# Patient Record
Sex: Male | Born: 1975 | Race: White | Hispanic: No | Marital: Married | Smoking: Never smoker
Health system: Southern US, Community
[De-identification: ages and names within clinical notes are randomized; demographics above are authoritative.]

## PROBLEM LIST (undated history)

## (undated) DIAGNOSIS — I517 Cardiomegaly: Secondary | ICD-10-CM

## (undated) DIAGNOSIS — E739 Lactose intolerance, unspecified: Secondary | ICD-10-CM

## (undated) HISTORY — DX: Cardiomegaly: I51.7

## (undated) HISTORY — PX: TONSILLECTOMY: SUR1361

## (undated) HISTORY — DX: Lactose intolerance, unspecified: E73.9

---

## 2006-01-09 ENCOUNTER — Emergency Department (HOSPITAL_COMMUNITY): Admission: EM | Admit: 2006-01-09 | Discharge: 2006-01-09 | Payer: Self-pay | Admitting: Emergency Medicine

## 2007-04-12 HISTORY — PX: VASECTOMY: SHX75

## 2008-12-14 ENCOUNTER — Emergency Department (HOSPITAL_BASED_OUTPATIENT_CLINIC_OR_DEPARTMENT_OTHER): Admission: EM | Admit: 2008-12-14 | Discharge: 2008-12-14 | Payer: Self-pay | Admitting: Emergency Medicine

## 2010-07-16 LAB — DIFFERENTIAL
Basophils Absolute: 0 10*3/uL (ref 0.0–0.1)
Basophils Relative: 0 % (ref 0–1)
Eosinophils Absolute: 0.1 10*3/uL (ref 0.0–0.7)
Eosinophils Relative: 1 % (ref 0–5)
Monocytes Absolute: 0.5 10*3/uL (ref 0.1–1.0)
Monocytes Relative: 6 % (ref 3–12)
Neutro Abs: 7.1 10*3/uL (ref 1.7–7.7)

## 2010-07-16 LAB — CBC
Hemoglobin: 15.9 g/dL (ref 13.0–17.0)
MCHC: 34.5 g/dL (ref 30.0–36.0)
MCV: 91 fL (ref 78.0–100.0)
RDW: 12.5 % (ref 11.5–15.5)

## 2010-07-16 LAB — STOOL CULTURE

## 2010-09-05 ENCOUNTER — Inpatient Hospital Stay (INDEPENDENT_AMBULATORY_CARE_PROVIDER_SITE_OTHER)
Admission: RE | Admit: 2010-09-05 | Discharge: 2010-09-05 | Disposition: A | Discharge status: Home / Self Care | Payer: Commercial Managed Care - PPO | Source: Ambulatory Visit | Attending: Emergency Medicine | Admitting: Emergency Medicine

## 2010-09-05 DIAGNOSIS — L0390 Cellulitis, unspecified: Secondary | ICD-10-CM

## 2010-09-05 DIAGNOSIS — S91309A Unspecified open wound, unspecified foot, initial encounter: Secondary | ICD-10-CM

## 2010-09-05 DIAGNOSIS — L0291 Cutaneous abscess, unspecified: Secondary | ICD-10-CM

## 2010-09-10 ENCOUNTER — Ambulatory Visit
Admission: RE | Admit: 2010-09-10 | Discharge: 2010-09-10 | Disposition: A | Discharge status: Home / Self Care | Payer: Commercial Managed Care - PPO | Source: Ambulatory Visit | Attending: Occupational Medicine | Admitting: Occupational Medicine

## 2010-09-10 ENCOUNTER — Other Ambulatory Visit: Payer: Self-pay | Admitting: Occupational Medicine

## 2010-09-10 DIAGNOSIS — Z0000 Encounter for general adult medical examination without abnormal findings: Secondary | ICD-10-CM

## 2016-05-17 DIAGNOSIS — D485 Neoplasm of uncertain behavior of skin: Secondary | ICD-10-CM | POA: Diagnosis not present

## 2016-05-17 DIAGNOSIS — D224 Melanocytic nevi of scalp and neck: Secondary | ICD-10-CM | POA: Diagnosis not present

## 2016-05-17 DIAGNOSIS — D2272 Melanocytic nevi of left lower limb, including hip: Secondary | ICD-10-CM | POA: Diagnosis not present

## 2016-05-17 DIAGNOSIS — D2271 Melanocytic nevi of right lower limb, including hip: Secondary | ICD-10-CM | POA: Diagnosis not present

## 2016-05-17 DIAGNOSIS — D225 Melanocytic nevi of trunk: Secondary | ICD-10-CM | POA: Diagnosis not present

## 2016-05-17 DIAGNOSIS — B078 Other viral warts: Secondary | ICD-10-CM | POA: Diagnosis not present

## 2016-11-11 ENCOUNTER — Ambulatory Visit
Admission: RE | Admit: 2016-11-11 | Discharge: 2016-11-11 | Disposition: A | Discharge status: Home / Self Care | Payer: PRIVATE HEALTH INSURANCE | Source: Ambulatory Visit | Attending: Occupational Medicine | Admitting: Occupational Medicine

## 2016-11-11 ENCOUNTER — Other Ambulatory Visit: Payer: Self-pay | Admitting: Occupational Medicine

## 2016-11-11 DIAGNOSIS — Z021 Encounter for pre-employment examination: Secondary | ICD-10-CM

## 2017-02-03 ENCOUNTER — Ambulatory Visit
Admission: RE | Admit: 2017-02-03 | Discharge: 2017-02-03 | Disposition: A | Discharge status: Home / Self Care | Payer: Worker's Compensation | Source: Ambulatory Visit | Attending: Nurse Practitioner | Admitting: Nurse Practitioner

## 2017-02-03 ENCOUNTER — Other Ambulatory Visit: Payer: Self-pay | Admitting: Nurse Practitioner

## 2017-02-03 DIAGNOSIS — M25512 Pain in left shoulder: Secondary | ICD-10-CM

## 2017-04-11 HISTORY — PX: SHOULDER SURGERY: SHX246

## 2017-05-17 DIAGNOSIS — D225 Melanocytic nevi of trunk: Secondary | ICD-10-CM | POA: Diagnosis not present

## 2017-05-17 DIAGNOSIS — D2261 Melanocytic nevi of right upper limb, including shoulder: Secondary | ICD-10-CM | POA: Diagnosis not present

## 2017-05-17 DIAGNOSIS — D485 Neoplasm of uncertain behavior of skin: Secondary | ICD-10-CM | POA: Diagnosis not present

## 2017-06-12 DIAGNOSIS — M9905 Segmental and somatic dysfunction of pelvic region: Secondary | ICD-10-CM | POA: Diagnosis not present

## 2017-06-12 DIAGNOSIS — M9903 Segmental and somatic dysfunction of lumbar region: Secondary | ICD-10-CM | POA: Diagnosis not present

## 2017-06-12 DIAGNOSIS — M9904 Segmental and somatic dysfunction of sacral region: Secondary | ICD-10-CM | POA: Diagnosis not present

## 2017-06-16 DIAGNOSIS — M9905 Segmental and somatic dysfunction of pelvic region: Secondary | ICD-10-CM | POA: Diagnosis not present

## 2017-06-16 DIAGNOSIS — M9903 Segmental and somatic dysfunction of lumbar region: Secondary | ICD-10-CM | POA: Diagnosis not present

## 2017-06-16 DIAGNOSIS — M9904 Segmental and somatic dysfunction of sacral region: Secondary | ICD-10-CM | POA: Diagnosis not present

## 2017-06-23 DIAGNOSIS — M9905 Segmental and somatic dysfunction of pelvic region: Secondary | ICD-10-CM | POA: Diagnosis not present

## 2017-06-23 DIAGNOSIS — M9903 Segmental and somatic dysfunction of lumbar region: Secondary | ICD-10-CM | POA: Diagnosis not present

## 2017-06-23 DIAGNOSIS — M9904 Segmental and somatic dysfunction of sacral region: Secondary | ICD-10-CM | POA: Diagnosis not present

## 2018-08-08 DIAGNOSIS — M722 Plantar fascial fibromatosis: Secondary | ICD-10-CM | POA: Diagnosis not present

## 2018-08-08 DIAGNOSIS — M79672 Pain in left foot: Secondary | ICD-10-CM | POA: Diagnosis not present

## 2021-03-15 DIAGNOSIS — R9431 Abnormal electrocardiogram [ECG] [EKG]: Secondary | ICD-10-CM

## 2021-03-19 ENCOUNTER — Encounter: Payer: Self-pay | Admitting: Cardiology

## 2021-04-22 NOTE — Progress Notes (Signed)
Cardiology Office Note:    Date:  04/23/2021   ID:  Kevin Bruce, DOB [birth year], MRN [id number]  PCP:  Street, Sharon Mt, MD  Cardiologist:  Shirlee More, MD   Referring MD: [address], Sharon Mt, *  ASSESSMENT:    1. Nonspecific abnormal electrocardiogram (ECG) (EKG)   2. Primary hypertension   3. Chest pain, unspecified type    PLAN:    In order of problems listed above:  I reviewed his EKGs he had a type I early repolarization variant felt to be benign on his first EKG it is not present today.  I do not think his EKG fulfilled criteria for LVH and his echocardiogram shows normal left ventricular size wall thickness systolic and diastolic function.  Left atrium is measured in 2 different manners the first being diameter normal the second is volume mildly enlarged and I do not think he has any true chamber enlargement.  I am on this weekend at the hospital and I will review the echocardiogram myself personally.  In summary I do not think he has structural heart disease.  His EKG pattern is felt to be a benign variant.  He has no family history of arrhythmia sudden death or cardiomyopathy. He has developed adult onset hypertension I think he is done well with his ARB he should continue it and with a BMI breaking 30 I encouraged him to have a moderate weight loss 10 to 20 pounds he should really help with his hypertensive control His chest pain is nonanginal.  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  Although he has no chest wall tenderness I suspect this is from his weightlifting bench pressing greater than 300 pounds and is most consistent with musculoskeletal or localized costal chondral pain.  Next appointment 6 to 8 weeks.  I told him if his cardiac CTA is normal I will give him a call and he would not need to come back to my office   Medication Adjustments/Labs and  Tests Ordered: Current medicines are reviewed at length with the patient today.  Concerns regarding medicines are outlined above.  Orders Placed This Encounter  Procedures   CT CORONARY MORPH W/CTA COR W/SCORE W/CA W/CM &/OR WO/CM   Basic metabolic panel   EKG 85-IDPO   Meds ordered this encounter  Medications   metoprolol tartrate (LOPRESSOR) 100 MG tablet    Sig: Take 1 tablet (100 mg total) by mouth once for 1 dose. Take two hours prior to your cardiac CT    Dispense:  1 tablet    Refill:  0     Chief Complaint  Patient presents with   Abnormal ECG    Interpreted as possible LVH echocardiogram performed showing normal left ventricular size wall thickness systolic and diastolic function.  Left atrium is interpreted as being mildly enlarged however in the mild measurements it was normal.  I reviewed that EKG is most consistent with an early repolarization pattern.  Also his wife is concerned he has had nonexertional chest pain  History of Present Illness:    Kevin Bruce is a 46 y.o. male with a history of hypertension who is being seen today for the evaluation of abnormal EKG with cardiomegaly at the request of Street, Sharon Mt, *.  Recent echocardiogram [facility] 03/15/2021, left ventricle normal size wall thickness systolic and diastolic function right ventricle normal size and function  the left atrium was moderately enlarged by volume but normal in size unable and no valvular abnormality.  EKG 03/09/2021 sinus rhythm early repolarization pattern precordial leads otherwise normal EKG His pattern is type I typically the most common and felt to be a benign variant  His history begins having a routine wellness visit in October. 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 Also his wife has asked [place] to be aware that he is having chest discomfort. This has been going on for several months he is a very vigorous athlete and does bench presses of over 300 pounds.  It occurs several times a week it is not exertional and not relieved with rest in the area of the left breast left lower ribs not positional not pleuritic no associated shortness of breath not severe and at times can wax and wane throughout the day. No associated respiratory or GI symptoms and never occurs with vigorous crosstraining and weightlifting It is not worsened.  He has no history of chest trauma. Family history is noteworthy for father with hypertension in his 45s and a mother with pacemaker in her 30s no family history of cardiomyopathy or sudden cardiac death  Past Medical History:  Diagnosis Date   Lactose intolerance    Helped with Lactaid   Left ventricular hypertrophy by electrocardiogram     Past Surgical History:  Procedure Laterality Date   SHOULDER SURGERY Left 2019   TONSILLECTOMY     As a child   VASECTOMY  2009   [facility]    Current Medications: Current Meds  Medication Sig   esomeprazole (NEXIUM) 20 MG capsule Take 20 mg by mouth daily as needed (reflux).   losartan (COZAAR) 25 MG tablet Take 25 mg by mouth daily.   metoprolol tartrate (LOPRESSOR) 100 MG tablet Take 1 tablet (100 mg total) by mouth once for 1 dose. Take two hours prior to your cardiac CT     Allergies:   Penicillins   Social History   Socioeconomic History   Marital status: Married    Spouse name: Not on file   Number of children: Not on file   Years of education: Not on file   Highest education level: Not on file  Occupational History   Not on file  Tobacco Use   Smoking status:  Never    Passive exposure: Never   Smokeless tobacco: Never  Vaping Use   Vaping Use: Never used  Substance and Sexual Activity   Alcohol use: Yes    Comment: Rare   Drug use: Never   Sexual activity: Not on file  Other Topics Concern   Not on file  Social History Narrative   Not on file   Social Determinants of Health   Financial Resource Strain: Not on file  Food Insecurity: Not on file  Transportation Needs: Not on file  Physical Activity: Not on file  Stress: Not on file  Social Connections: Not on file     Family History: The patient's family history includes COPD in his mother; Colon cancer in his maternal  grandmother; Healthy in his sister; Hypertension in his father; Other in his brother.  ROS:   ROS Please see the history of present illness.     All other systems reviewed and are negative.  EKGs/Labs/Other Studies Reviewed:    The following studies were reviewed today:   EKG:  EKG is  ordered today.  The ekg ordered today is personally reviewed and demonstrates sinus rhythm normal I do not see the early repolarization pattern on his EKG today    Physical Exam:    VS:  BP 130/80 (BP Location: Right Arm)    Pulse 73    Ht 5\' 10"  (1.778 m)    Wt 213 lb 12.8 oz (97 kg)    SpO2 99%    BMI 30.68 kg/m     Wt Readings from Last 3 Encounters:  04/23/21 213 lb 12.8 oz (97 kg)  03/09/21 211 lb (95.7 kg)     GEN:  Well nourished, well developed in no acute distress HEENT: Normal NECK: No JVD; No carotid bruits LYMPHATICS: No lymphadenopathy CARDIAC: RRR, no murmurs, rubs, gallops RESPIRATORY:  Clear to auscultation without rales, wheezing or rhonchi  ABDOMEN: Soft, non-tender, non-distended MUSCULOSKELETAL:  No edema; No deformity  SKIN: Warm and dry NEUROLOGIC:  Alert and oriented x 3 PSYCHIATRIC:  Normal affect     Signed, Shirlee More, MD  04/23/2021 11:21 AM    [place]

## 2021-04-23 ENCOUNTER — Encounter: Payer: Self-pay | Admitting: Cardiology

## 2021-04-23 ENCOUNTER — Other Ambulatory Visit: Payer: Self-pay

## 2021-04-23 ENCOUNTER — Ambulatory Visit: Payer: 59 | Admitting: Cardiology

## 2021-04-23 VITALS — BP 130/80 | HR 73 | Ht 70.0 in | Wt 213.8 lb

## 2021-04-23 DIAGNOSIS — R9431 Abnormal electrocardiogram [ECG] [EKG]: Secondary | ICD-10-CM | POA: Diagnosis not present

## 2021-04-23 DIAGNOSIS — R079 Chest pain, unspecified: Secondary | ICD-10-CM

## 2021-04-23 DIAGNOSIS — I10 Essential (primary) hypertension: Secondary | ICD-10-CM

## 2021-04-23 MED ORDER — METOPROLOL TARTRATE 100 MG PO TABS: 100.0000 mg | ORAL_TABLET | Freq: Once | ORAL | 0 refills | Status: AC

## 2021-04-23 NOTE — Patient Instructions (Signed)
Medication Instructions:  Your physician recommends that you continue on your current medications as directed. Please refer to the Current Medication list given to you today.  *If you need a refill on your cardiac medications before your next appointment, please call your pharmacy*   Lab Work: Your physician recommends that you return for lab work in: Within one week of your cardiac CT BMP If you have labs (blood work) drawn today and your tests are completely normal, you will receive your results only by: [place] (if you have MyChart) OR A paper copy in the mail If you have any lab test that is abnormal or we need to change your treatment, we will call you to review the results.   Testing/Procedures:   Your cardiac CT will be scheduled at the below location:   [facility] [address] [place], [place] [address] [phone]  If scheduled at [facility], please arrive at the [facility] main entrance (entrance A) of [employer] 30 minutes prior to test start time. You can use the FREE valet parking offered at the main entrance (encouraged to control the heart rate for the test) Proceed to the [facility] Radiology Department (first floor) to check-in and test prep.  Please follow these instructions carefully (unless otherwise directed):  On the Night Before the Test: Be sure to Drink plenty of water. Do not consume any caffeinated/decaffeinated beverages or chocolate 12 hours prior to your test. Do not take any antihistamines 12 hours prior to your test.  On the Day of the Test: Drink plenty of water until 1 hour prior to the test. Do not eat any food 4 hours prior to the test. You may take your regular medications prior to the test.  Take metoprolol (Lopressor) two hours prior to test.  After the Test: Drink plenty of water. After receiving IV contrast, you may experience a mild flushed feeling. This is normal. On occasion, you may  experience a mild rash up to 24 hours after the test. This is not dangerous. If this occurs, you can take Benadryl 25 mg and increase your fluid intake. If you experience trouble breathing, this can be serious. If it is severe call 911 IMMEDIATELY. If it is mild, please call our office. If you take any of these medications: Glipizide/Metformin, Avandament, Glucavance, please do not take 48 hours after completing test unless otherwise instructed.  Please allow 2-4 weeks for scheduling of routine cardiac CTs. Some insurance companies require a pre-authorization which may delay scheduling of this test.   For non-scheduling related questions, please contact the cardiac imaging nurse navigator should you have any questions/concerns: Marchia Bond, Cardiac Imaging Nurse Navigator Gordy Clement, Cardiac Imaging Nurse Navigator [facility] Direct Office Dial: [phone]   For scheduling needs, including cancellations and rescheduling, please call [place], [phone].    Follow-Up: At [facility], you and your health needs are our priority.  As part of our continuing mission to provide you with exceptional heart care, we have created designated Provider Care Teams.  These Care Teams include your primary Cardiologist (physician) and Advanced Practice Providers (APPs -  Physician Assistants and Nurse Practitioners) who all work together to provide you with the care you need, when you need it.  We recommend signing up for the patient portal called "MyChart".  Sign up information is provided on this After Visit Summary.  MyChart is used to connect with patients for Virtual Visits (Telemedicine).  Patients  are able to view lab/test results, encounter notes, upcoming appointments, etc.  Non-urgent messages can be sent to your provider as well.   To learn more about what you can do with MyChart, go to [email or web address].    Your next appointment:   8 week(s)  The  format for your next appointment:   In Person  Provider:   Shirlee More, MD    Other Instructions

## 2021-05-18 ENCOUNTER — Telehealth (HOSPITAL_COMMUNITY): Payer: Self-pay | Admitting: Emergency Medicine

## 2021-05-18 NOTE — Telephone Encounter (Signed)
Reaching out to patient to offer assistance regarding upcoming cardiac imaging study; pt verbalizes understanding of appt date/time, parking situation and where to check in, pre-test NPO status and medications ordered, and verified current allergies; name and call back number provided for further questions should they arise Marchia Bond RN Navigator Cardiac Imaging [facility] [phone] office [phone] cell  Getting labs in the AM.  100mg  metoprolol tartrate Arrival 730  Denies iv issues

## 2021-05-18 NOTE — Telephone Encounter (Signed)
Attempted to call patient regarding upcoming cardiac CT appointment. Left message on voicemail with name and callback number Rebbie Lauricella RN Navigator Cardiac Imaging [facility] [phone] Office [phone] Cell  Reminded to get labs 

## 2021-05-19 ENCOUNTER — Ambulatory Visit (HOSPITAL_COMMUNITY): Payer: 59

## 2021-05-19 ENCOUNTER — Other Ambulatory Visit: Payer: Self-pay

## 2021-05-19 ENCOUNTER — Other Ambulatory Visit: Payer: 59 | Admitting: *Deleted

## 2021-05-19 DIAGNOSIS — R079 Chest pain, unspecified: Secondary | ICD-10-CM

## 2021-05-19 LAB — BASIC METABOLIC PANEL
BUN/Creatinine Ratio: 13 (ref 9–20)
BUN: 16 mg/dL (ref 6–24)
CO2: 27 mmol/L (ref 20–29)
Calcium: 9.6 mg/dL (ref 8.7–10.2)
Chloride: 102 mmol/L (ref 96–106)
Creatinine, Ser: 1.28 mg/dL — ABNORMAL HIGH (ref 0.76–1.27)
Glucose: 97 mg/dL (ref 70–99)
Potassium: 4.5 mmol/L (ref 3.5–5.2)
Sodium: 143 mmol/L (ref 134–144)
eGFR: 71 mL/min/{1.73_m2} (ref >59)

## 2021-05-20 ENCOUNTER — Ambulatory Visit (HOSPITAL_COMMUNITY)
Admission: RE | Admit: 2021-05-20 | Discharge: 2021-05-20 | Disposition: A | Discharge status: Home / Self Care | Payer: 59 | Source: Ambulatory Visit | Attending: Cardiology | Admitting: Cardiology

## 2021-05-20 ENCOUNTER — Encounter (HOSPITAL_COMMUNITY): Payer: Self-pay

## 2021-05-20 DIAGNOSIS — R079 Chest pain, unspecified: Secondary | ICD-10-CM

## 2021-05-20 MED ORDER — NITROGLYCERIN 0.4 MG SL SUBL
0.8000 mg | SUBLINGUAL_TABLET | Freq: Once | SUBLINGUAL | Status: AC
  Administered 2021-05-20: 0.8 mg via SUBLINGUAL

## 2021-05-20 MED ORDER — IOHEXOL 350 MG/ML SOLN
95.0000 mL | Freq: Once | INTRAVENOUS | Status: AC | PRN
  Administered 2021-05-20: 95 mL via INTRAVENOUS

## 2021-05-20 MED ORDER — NITROGLYCERIN 0.4 MG SL SUBL
SUBLINGUAL_TABLET | SUBLINGUAL | Status: AC
  Filled 2021-05-20: qty 2

## 2021-06-25 ENCOUNTER — Ambulatory Visit: Payer: 59 | Admitting: Cardiology

## 2021-10-19 ENCOUNTER — Ambulatory Visit: Payer: 59 | Admitting: Podiatry

## 2021-10-19 ENCOUNTER — Ambulatory Visit (INDEPENDENT_AMBULATORY_CARE_PROVIDER_SITE_OTHER): Payer: 59

## 2021-10-19 DIAGNOSIS — M722 Plantar fascial fibromatosis: Secondary | ICD-10-CM

## 2021-10-19 MED ORDER — METHYLPREDNISOLONE 4 MG PO TBPK: ORAL_TABLET | ORAL | 0 refills | Status: AC

## 2021-10-19 MED ORDER — MELOXICAM 15 MG PO TABS: 15.0000 mg | ORAL_TABLET | Freq: Every day | ORAL | 1 refills | Status: AC

## 2021-10-19 MED ORDER — BETAMETHASONE SOD PHOS & ACET 6 (3-3) MG/ML IJ SUSP
3.0000 mg | Freq: Once | INTRAMUSCULAR | Status: AC
  Administered 2021-10-19: 3 mg via INTRA_ARTICULAR

## 2021-10-19 NOTE — Progress Notes (Signed)
   Subjective: 46 y.o. male presenting to the office today as a new patient for evaluation of chronic bilateral heel pain/plantar fasciitis.  Patient states that he has had plantar fasciitis intermittently for several years.  He has worn custom molded orthotics which helped significantly however recently over the last few months the pain has returned and is not resolving on its own.  He presents for further treatment and evaluation   Past Medical History:  Diagnosis Date   Lactose intolerance    Helped with Lactaid   Left ventricular hypertrophy by electrocardiogram    Past Surgical History:  Procedure Laterality Date   SHOULDER SURGERY Left 2019   TONSILLECTOMY     As a child   VASECTOMY  2009   [facility]   Allergies  Allergen Reactions   Penicillins Rash     Objective: Physical Exam General: The patient is alert and oriented x3 in no acute distress.  Dermatology: Skin is warm, dry and supple bilateral lower extremities. Negative for open lesions or macerations bilateral.   Vascular: Dorsalis Pedis and Posterior Tibial pulses palpable bilateral.  Capillary fill time is immediate to all digits.  Neurological: Epicritic and protective threshold intact bilateral.   Musculoskeletal: Tenderness to palpation to the plantar aspect of the right and left heel along the plantar fascia.  Right greater than left.  All other joints range of motion within normal limits bilateral. Strength 5/5 in all groups bilateral.   Radiographic exam: Normal osseous mineralization. Joint spaces preserved. No fracture/dislocation/boney destruction. No other soft tissue abnormalities or radiopaque foreign bodies.  Plantar and posterior heel spurs noted on lateral view bilateral  Assessment: 1. Plantar fasciitis bilateral. RT > LT  Plan of Care:  1. Patient evaluated. Xrays reviewed.   2. Injection of 0.5cc Celestone soluspan injected into the right plantar fascia  3. Rx for Medrol Dose Pack  placed 4. Rx for Meloxicam ordered for patient. 5.  Prescription for custom molded orthotics provided for the patient to take to [place] lab 6. Instructed patient regarding therapies and modalities at home to alleviate symptoms.  7. Return to clinic in 4 weeks.     Edrick Kins, DPM [facility]  Dr. Edrick Kins, DPM    [address], [place] [address]                Office [phone]  Fax [phone]

## 2022-01-04 ENCOUNTER — Ambulatory Visit
Admission: RE | Admit: 2022-01-04 | Discharge: 2022-01-04 | Disposition: A | Discharge status: Home / Self Care | Payer: 59 | Source: Ambulatory Visit | Attending: Nurse Practitioner | Admitting: Nurse Practitioner

## 2022-01-04 ENCOUNTER — Other Ambulatory Visit: Payer: Self-pay | Admitting: Nurse Practitioner

## 2022-01-04 DIAGNOSIS — Z0000 Encounter for general adult medical examination without abnormal findings: Secondary | ICD-10-CM

## 2022-06-01 ENCOUNTER — Emergency Department (HOSPITAL_BASED_OUTPATIENT_CLINIC_OR_DEPARTMENT_OTHER)
Admission: EM | Admit: 2022-06-01 | Discharge: 2022-06-01 | Disposition: A | Discharge status: Home / Self Care | Payer: No Typology Code available for payment source | Attending: Emergency Medicine | Admitting: Emergency Medicine

## 2022-06-01 ENCOUNTER — Other Ambulatory Visit: Payer: Self-pay

## 2022-06-01 DIAGNOSIS — Z021 Encounter for pre-employment examination: Secondary | ICD-10-CM | POA: Diagnosis present

## 2022-06-01 DIAGNOSIS — Z79899 Other long term (current) drug therapy: Secondary | ICD-10-CM | POA: Diagnosis not present

## 2022-06-01 DIAGNOSIS — Z0283 Encounter for blood-alcohol and blood-drug test: Secondary | ICD-10-CM

## 2022-06-01 DIAGNOSIS — I10 Essential (primary) hypertension: Secondary | ICD-10-CM | POA: Insufficient documentation

## 2022-06-01 NOTE — ED Triage Notes (Signed)
Pt here for an incident at work. Pt reports he only needs lab testing. He has no other complaints.

## 2022-06-01 NOTE — ED Provider Notes (Addendum)
  [facility] Provider Note   CSN: [id number] Arrival date & time: 06/01/22  [id number]     History  No chief complaint on file.   Kevin Bruce is a 47 y.o. male.  HPI 47 year old male presents for employment drug testing. 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 He has no complaints.   Home Medications Prior to Admission medications   Medication Sig Start Date End Date Taking? Authorizing Provider  esomeprazole (NEXIUM) 20 MG capsule Take 20 mg by mouth daily as needed (reflux).    [provider]  losartan (COZAAR) 25 MG tablet Take 25 mg by mouth daily.    [provider]  meloxicam (MOBIC) 15 MG tablet Take 1 tablet (15 mg total) by mouth daily. 10/19/21   Edrick Kins, DPM  methylPREDNISolone (MEDROL DOSEPAK) 4 MG TBPK tablet 6 day dose pack - take as directed 10/19/21   Edrick Kins, DPM  metoprolol tartrate (LOPRESSOR) 100 MG tablet Take 1 tablet (100 mg total) by mouth once for 1 dose. Take two hours prior to your cardiac CT 04/23/21 04/23/21  Richardo Priest, MD      Allergies    Penicillins    Review of Systems   Review of Systems  Physical Exam Updated Vital Signs BP (!) 161/91 (BP Location: Right Arm)   Pulse 67   Temp 98 F (36.7 C)   Resp 14   SpO2 100%  Physical Exam Vitals and nursing note reviewed.  Constitutional:      General: He is not in acute distress.    Appearance: He is well-developed. He is not ill-appearing or diaphoretic.  HENT:     Head: Normocephalic and atraumatic.  Pulmonary:     Effort: Pulmonary effort is normal.  Neurological:     Mental Status: He is alert.  Psychiatric:        Mood and Affect: Mood is not anxious.     ED Results / Procedures / Treatments   Labs (all labs ordered are listed, but only abnormal results are displayed) Labs Reviewed - No data to display  EKG None  Radiology No results  found.  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  There were no injuries related to the incident and he is here solely for testing.  No medical complaints.  He is noted to be hypertensive, patient states that he no longer takes his meds though I have encouraged him to reevaluate this and talk to his PCP.  He declines a refill of his meds currently.        Final Clinical Impression(s) / ED Diagnoses Final diagnoses:  Encounter for employment-related drug testing  Asymptomatic hypertension    Rx / DC Orders ED Discharge Orders     None         Sherwood Gambler, MD 06/01/22 [id number]    Sherwood Gambler, MD 06/01/22 [phone]

## 2022-07-23 IMAGING — CT CT HEART MORP W/ CTA COR W/ SCORE W/ CA W/CM &/OR W/O CM
4 of 7 series · 8 of 20 positions shown, 9 images · IV contrast (APPLIED)
Comparison: None.
COMPARISON: None.

Addendum:
EXAM:
OVER-READ INTERPRETATION  CT CHEST

The following report is an over-read performed by radiologist Dr.
Jordan Alejandro Bailo [[email or web address]] on 05/20/2021. This
over-read does not include interpretation of cardiac or coronary
anatomy or pathology. The coronary calcium score/coronary CTA
interpretation by the cardiologist is attached.
HISTORY: 45 yo male with chest pain/anginal equiv, ECGs or troponins abnormal
Cardiac/Coronary CTA
TECHNIQUE: The patient was scanned on a Siemens Force scanner.
PROTOCOL: A 100 kV prospective scan was triggered in the descending thoracic
aorta at 111 HU's. Axial non-contrast 3 mm slices were carried out
through the heart. The data set was analyzed on a dedicated work
station and scored using the Agatson method. Gantry rotation speed
was 250 msecs and collimation was .6 mm. Beta blockade and 0.8 mg of
sl NTG was given. The 3D data set was reconstructed in 5% intervals
of the 35-75 % of the R-R cycle. Diastolic phases were analyzed on a
dedicated work station using MPR, MIP and VRT modes. The patient
received 95mL OMNIPAQUE IOHEXOL 350 MG/ML SOLN of contrast.

[Series 6: best diast · axial · 0.39mm/px · z∈[+1180,+1225]mm · 2 of 338 slices shown, 3 images]
[im 113/338  vessel]
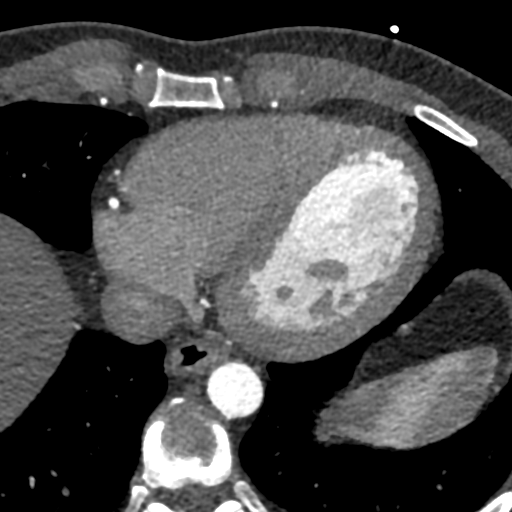
[im 113/338  lung]
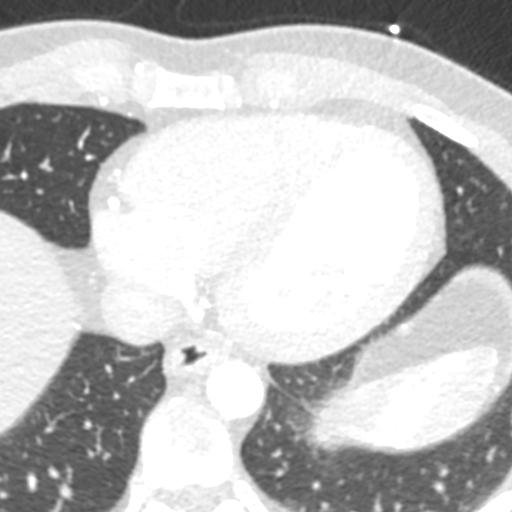
[im 225/338  vessel]
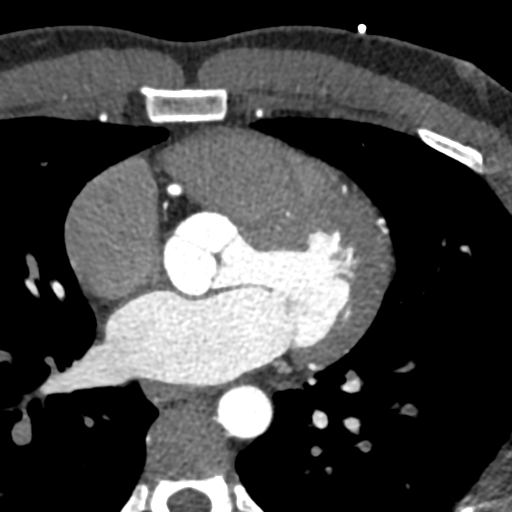

[Series 7: best syst · axial · 0.39mm/px · z∈[+1180,+1225]mm · 2 of 338 slices shown]
[im 113/338  vessel]
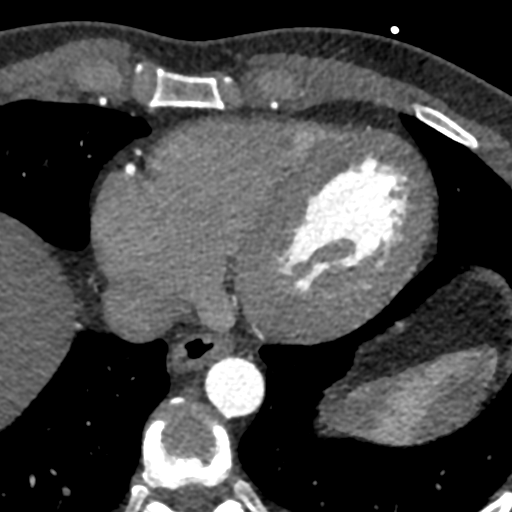
[im 225/338  vessel]
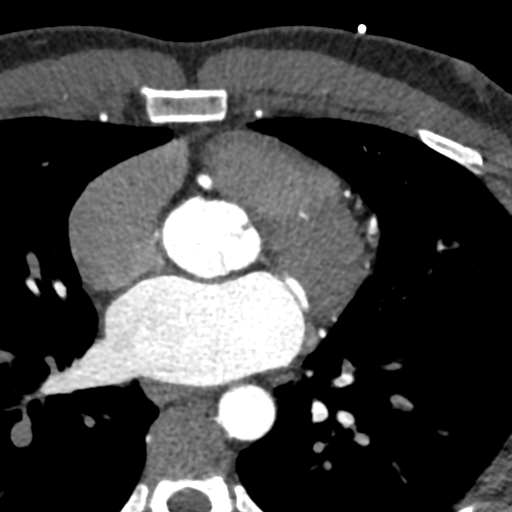

[Series 8: ts diast sharp · axial · 0.39mm/px · z∈[+1180,+1225]mm · 2 of 338 slices shown]
[im 113/338  lung]
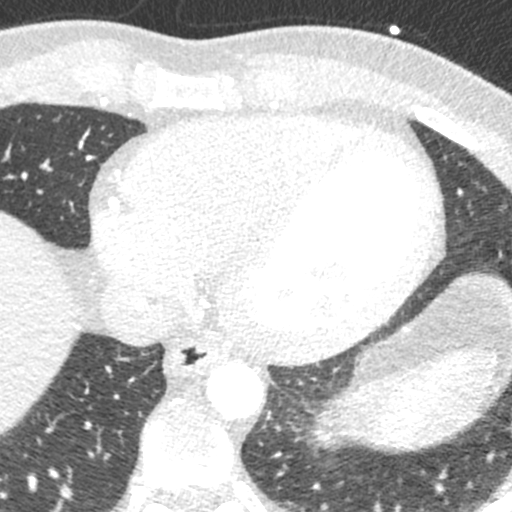
[im 225/338  lung]
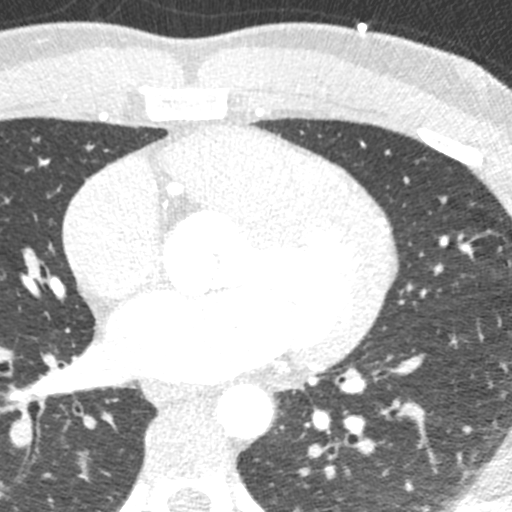

[Series 9: ts syst sharp · axial · 0.39mm/px · z∈[+1180,+1225]mm · 2 of 338 slices shown]
[im 113/338  lung]
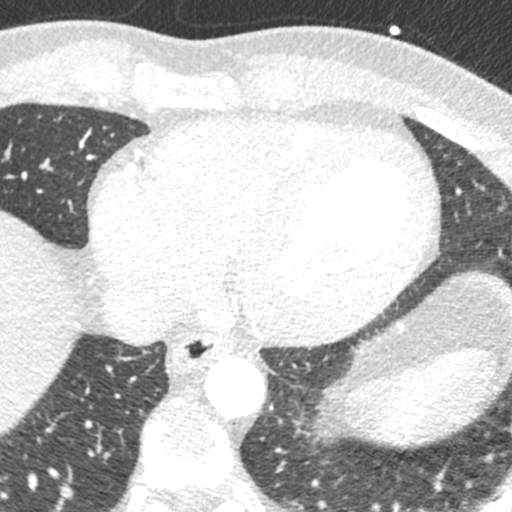
[im 225/338  lung]
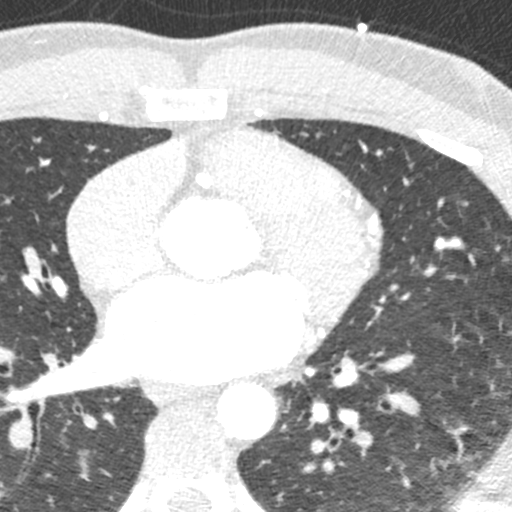

[8 of 20 positions shown; findings below may reference images not displayed]

FINDINGS: Within the visualized portions of the thorax there are no suspicious
appearing pulmonary nodules or masses, there is no acute
consolidative airspace disease, no pleural effusions, no
pneumothorax and no lymphadenopathy. Visualized portions of the
upper abdomen are unremarkable. There are no aggressive appearing
lytic or blastic lesions noted in the visualized portions of the
skeleton.
IMPRESSION: 1. No significant incidental noncardiac findings are noted.
FINDINGS: Quality: Very good, HR 54

Coronary calcium score: The patient's coronary artery calcium score
is 0, which places the patient in the 0 percentile.

Coronary arteries: Normal coronary origins.  Right dominance.

Right Coronary Artery: Dominant.  Normal

Left Main Coronary Artery: Normal. Bifurcates into the LAD and LCx
arteries as usual.

Left Anterior Descending Coronary Artery: Normal, anterior vessel
that reaches the apex. Large D1 which branches, no disease.

Left Circumflex Artery: Normal.

Aorta: Normal size, 30 mm at the mid ascending aorta (level of the
PA bifurcation) measured double oblique. No calcifications. No
dissection.

Aortic Valve: Trileaflet. No calcifications.

Other findings:

Normal pulmonary vein drainage into the left atrium.

Normal left atrial appendage without a thrombus.

Mildly dilated main pulmonary artery at 30 mm, possibly suggestive
of pulmonary hypertension.
IMPRESSION: 1. No evidence of CAD, CADRADS = 0.

2. Coronary calcium score of 0. This was 0 percentile for age and
sex matched control.

3. Normal coronary origin with right dominance.

4. Mildly dilated main pulmonary artery at 30 mm, possibly
suggestive of pulmonary hypertension.

5. Consider non-coronary causes of chest pain.

*** End of Addendum ***
EXAM:
OVER-READ INTERPRETATION  CT CHEST

The following report is an over-read performed by radiologist Dr.
Jordan Alejandro Bailo [REDACTED] on 05/20/2021. This
over-read does not include interpretation of cardiac or coronary
anatomy or pathology. The coronary calcium score/coronary CTA
interpretation by the cardiologist is attached.
FINDINGS: Within the visualized portions of the thorax there are no suspicious
appearing pulmonary nodules or masses, there is no acute
consolidative airspace disease, no pleural effusions, no
pneumothorax and no lymphadenopathy. Visualized portions of the
upper abdomen are unremarkable. There are no aggressive appearing
lytic or blastic lesions noted in the visualized portions of the
skeleton.
IMPRESSION: 1. No significant incidental noncardiac findings are noted.

## 2022-08-08 ENCOUNTER — Encounter (HOSPITAL_BASED_OUTPATIENT_CLINIC_OR_DEPARTMENT_OTHER): Payer: Self-pay | Admitting: Emergency Medicine

## 2022-08-08 ENCOUNTER — Other Ambulatory Visit: Payer: Self-pay

## 2022-08-08 ENCOUNTER — Emergency Department (HOSPITAL_BASED_OUTPATIENT_CLINIC_OR_DEPARTMENT_OTHER)
Admission: EM | Admit: 2022-08-08 | Discharge: 2022-08-08 | Disposition: A | Discharge status: Home / Self Care | Payer: 59 | Attending: Emergency Medicine | Admitting: Emergency Medicine

## 2022-08-08 ENCOUNTER — Emergency Department (HOSPITAL_BASED_OUTPATIENT_CLINIC_OR_DEPARTMENT_OTHER): Payer: 59 | Admitting: Radiology

## 2022-08-08 DIAGNOSIS — R079 Chest pain, unspecified: Secondary | ICD-10-CM | POA: Insufficient documentation

## 2022-08-08 DIAGNOSIS — Z79899 Other long term (current) drug therapy: Secondary | ICD-10-CM | POA: Insufficient documentation

## 2022-08-08 DIAGNOSIS — I10 Essential (primary) hypertension: Secondary | ICD-10-CM | POA: Insufficient documentation

## 2022-08-08 LAB — BASIC METABOLIC PANEL
Anion gap: 7 (ref 5–15)
BUN: 17 mg/dL (ref 6–20)
CO2: 29 mmol/L (ref 22–32)
Calcium: 9.1 mg/dL (ref 8.9–10.3)
Chloride: 105 mmol/L (ref 98–111)
Creatinine, Ser: 1.19 mg/dL (ref 0.61–1.24)
GFR, Estimated: >60 mL/min (ref >60)
Glucose, Bld: 89 mg/dL (ref 70–99)
Potassium: 3.9 mmol/L (ref 3.5–5.1)
Sodium: 141 mmol/L (ref 135–145)

## 2022-08-08 LAB — TROPONIN I (HIGH SENSITIVITY)
Troponin I (High Sensitivity): 2 ng/L (ref <18)
Troponin I (High Sensitivity): 2 ng/L (ref <18)

## 2022-08-08 LAB — CBC
HCT: 42.1 % (ref 39.0–52.0)
Hemoglobin: 14.4 g/dL (ref 13.0–17.0)
MCH: 31.2 pg (ref 26.0–34.0)
MCHC: 34.2 g/dL (ref 30.0–36.0)
MCV: 91.3 fL (ref 80.0–100.0)
Platelets: 285 10*3/uL (ref 150–400)
RBC: 4.61 MIL/uL (ref 4.22–5.81)
RDW: 12.8 % (ref 11.5–15.5)
WBC: 8.6 10*3/uL (ref 4.0–10.5)
nRBC: 0 % (ref 0.0–0.2)

## 2022-08-08 MED ORDER — ACETAMINOPHEN 325 MG PO TABS: 650.0000 mg | ORAL_TABLET | Freq: Four times a day (QID) | ORAL | 0 refills | Status: AC | PRN

## 2022-08-08 MED ORDER — ACETAMINOPHEN 500 MG PO TABS
1000.0000 mg | ORAL_TABLET | Freq: Once | ORAL | Status: AC
  Administered 2022-08-08: 1000 mg via ORAL
  Filled 2022-08-08: qty 2

## 2022-08-08 MED ORDER — LIDOCAINE VISCOUS HCL 2 % MT SOLN
15.0000 mL | Freq: Once | OROMUCOSAL | Status: AC
  Administered 2022-08-08: 15 mL via ORAL
  Filled 2022-08-08: qty 15

## 2022-08-08 MED ORDER — ALUM & MAG HYDROXIDE-SIMETH 200-200-20 MG/5ML PO SUSP
30.0000 mL | Freq: Once | ORAL | Status: AC
  Administered 2022-08-08: 30 mL via ORAL
  Filled 2022-08-08: qty 30

## 2022-08-08 MED ORDER — FAMOTIDINE 20 MG PO TABS: 20.0000 mg | ORAL_TABLET | Freq: Two times a day (BID) | ORAL | 0 refills | Status: AC

## 2022-08-08 MED ORDER — OMEPRAZOLE 20 MG PO CPDR: 20.0000 mg | DELAYED_RELEASE_CAPSULE | Freq: Every day | ORAL | 0 refills | Status: AC

## 2022-08-08 MED ORDER — FAMOTIDINE 20 MG PO TABS
20.0000 mg | ORAL_TABLET | Freq: Once | ORAL | Status: AC
  Administered 2022-08-08: 20 mg via ORAL
  Filled 2022-08-08: qty 1

## 2022-08-08 NOTE — ED Provider Notes (Signed)
[place] [facility]ERGENCY DEPARTMENT AT [facility] Provider Note   CSN: [id number] Arrival date & time: 08/08/22  1447     History  Chief Complaint  Patient presents with   Chest Pain    Kevin Bruce is a 47 y.o. male with a past medical history of hypertension presents today for evaluation of chest pain.  Patient states he started to have chest pain 4 days ago.  The pain is on the left side of his chest, rating to his shoulder and left shoulder blade.  History of shoulder surgery.  States the pain got worse with deep breathing and movement.  Chest pain has been intermittent in the last 4 days. Denies any shortness of breath.  Denies any sweating, extremity weakness or numbness.  Denies any history of DVT/PE.  No recent travel or surgery.  Denies any cough.  Denies any leg swelling.   Chest Pain     Past Medical History:  Diagnosis Date   Lactose intolerance    Helped with Lactaid   Left ventricular hypertrophy by electrocardiogram    Past Surgical History:  Procedure Laterality Date   SHOULDER SURGERY Left 2019   TONSILLECTOMY     As a child   VASECTOMY  2009   [facility]     Home Medications Prior to Admission medications   Medication Sig Start Date End Date Taking? Authorizing Provider  famotidine (PEPCID) 20 MG tablet Take 1 tablet (20 mg total) by mouth 2 (two) times daily. 08/08/22  Yes Jeanelle Malling, PA  omeprazole (PRILOSEC) 20 MG capsule Take 1 capsule (20 mg total) by mouth daily. 08/08/22  Yes Jeanelle Malling, PA  esomeprazole (NEXIUM) 20 MG capsule Take 20 mg by mouth daily as needed (reflux).    [provider]  losartan (COZAAR) 25 MG tablet Take 25 mg by mouth daily.    [provider]  meloxicam (MOBIC) 15 MG tablet Take 1 tablet (15 mg total) by mouth daily. 10/19/21   Felecia Shelling, DPM  methylPREDNISolone (MEDROL DOSEPAK) 4 MG TBPK tablet 6 day dose pack - take as directed 10/19/21   Felecia Shelling, DPM  metoprolol tartrate (LOPRESSOR) 100  MG tablet Take 1 tablet (100 mg total) by mouth once for 1 dose. Take two hours prior to your cardiac CT 04/23/21 04/23/21  Baldo Daub, MD      Allergies    Penicillins    Review of Systems   Review of Systems  Cardiovascular:  Positive for chest pain.    Physical Exam Updated Vital Signs BP (!) 159/95 (BP Location: Right Arm)   Pulse 72   Temp 97.7 F (36.5 C)   Resp 16   Ht 5\' 10"  (1.778 m)   Wt 97.5 kg   SpO2 100%   BMI 30.85 kg/m  Physical Exam Vitals and nursing note reviewed.  Constitutional:      Appearance: Normal appearance.  HENT:     Head: Normocephalic and atraumatic.     Mouth/Throat:     Mouth: Mucous membranes are moist.  Eyes:     General: No scleral icterus. Cardiovascular:     Rate and Rhythm: Normal rate and regular rhythm.     Pulses: Normal pulses.     Heart sounds: Normal heart sounds.  Pulmonary:     Effort: Pulmonary effort is normal.     Breath sounds: Normal breath sounds.  Abdominal:     General: Abdomen is flat.     Palpations: Abdomen is  soft.     Tenderness: There is no abdominal tenderness.  Musculoskeletal:        General: No deformity.  Skin:    General: Skin is warm.     Findings: No rash.  Neurological:     General: No focal deficit present.     Mental Status: He is alert.  Psychiatric:        Mood and Affect: Mood normal.     ED Results / Procedures / Treatments   Labs (all labs ordered are listed, but only abnormal results are displayed) Labs Reviewed  BASIC METABOLIC PANEL  CBC  TROPONIN I (HIGH SENSITIVITY)  TROPONIN I (HIGH SENSITIVITY)    EKG None  Radiology DG Chest 2 View  Result Date: 08/08/2022 CLINICAL DATA:  Left-sided chest pain EXAM: CHEST - 2 VIEW COMPARISON:  01/04/2022 FINDINGS: The patient has taken a poor inspiration. Allowing for that, the heart and mediastinum normal in the lungs are clear. No effusion. No abnormal bone finding. IMPRESSION: Poor inspiration. No active disease suspected.  Electronically Signed   By: Paulina Fusi M.D.   On: 08/08/2022 15:17    Procedures Procedures    Medications Ordered in ED Medications  acetaminophen (TYLENOL) tablet 1,000 mg (1,000 mg Oral Given 08/08/22 1649)  alum & mag hydroxide-simeth (MAALOX/MYLANTA) 200-200-20 MG/5ML suspension 30 mL (30 mLs Oral Given 08/08/22 1649)    And  lidocaine (XYLOCAINE) 2 % viscous mouth solution 15 mL (15 mLs Oral Given 08/08/22 1649)  famotidine (PEPCID) tablet 20 mg (20 mg Oral Given 08/08/22 1649)    ED Course/ Medical Decision Making/ A&P                             Medical Decision Making Amount and/or Complexity of Data Reviewed Labs: ordered. Radiology: ordered.  Risk OTC drugs. Prescription drug management.   This patient presents to the ED for chest pain, this involves an extensive number of treatment options, and is a complaint that carries with a high risk of complications and morbidity.  The differential diagnosis includes ACS, pericarditis, PE, pneumothorax, pneumonia, less likely dissection with essentially normal blood pressure, symmetric bilateral pulses, and no back pain.  This is not an exhaustive list.  Lab tests: I ordered and personally interpreted labs.  The pertinent results include: WBC unremarkable. Hbg unremarkable. Platelets unremarkable. Electrolytes unremarkable. BUN, creatinine unremarkable.  Delta troponin negative.  Imaging studies: I ordered imaging studies. I personally reviewed, interpreted imaging and agree with the radiologist's interpretations. The results include: Chest x-ray negative.  Problem list/ ED course/ Critical interventions/ Medical management: HPI: See above Vital signs within normal range and stable throughout visit. Laboratory/imaging studies significant for: See above. On physical examination, patient is afebrile and appears in no acute distress. Exam without evidence of volume overload so doubt heart failure. EKG without signs of active  ischemia. Given the timing of pain to ER presentation, delta troponin was negative so doubt NSTEMI. Presentation not consistent with acute PE (PERC negative), pneumothorax (not visualized on chest xr), thoracic aortic dissection, pericarditis, tamponade, pneumonia (no infectious symptoms, clear chest xr), myocarditis (no recent illness, neg trop).  Given Pepcid, Maalox.  Reevaluation of patient after this medication showed that the patient improved.  He rates his pain 1 out of 10 after the medications.  HEART score: 1 so plan to discharge patient home with cardiology follow up.  Advised patient to take his medication as prescribed and follow-up with cardiology  for further evaluation and management.  Strict return precaution discussed.  I have reviewed the patient home medicines and have made adjustments as needed.  Cardiac monitoring/EKG: The patient was maintained on a cardiac monitor.  I personally reviewed and interpreted the cardiac monitor which showed an underlying rhythm of: sinus rhythm.  Additional history obtained: External records from outside source obtained and reviewed including: Chart review including previous notes, labs, imaging.  Consultations obtained:  Disposition Continued outpatient therapy. Follow-up with cardiology recommended for reevaluation of symptoms. Treatment plan discussed with patient.  Pt acknowledged understanding was agreeable to the plan. Worrisome signs and symptoms were discussed with patient, and patient acknowledged understanding to return to the ED if they noticed these signs and symptoms. Patient was stable upon discharge.   This chart was dictated using voice recognition software.  Despite best efforts to proofread,  errors can occur which can change the documentation meaning.          Final Clinical Impression(s) / ED Diagnoses Final diagnoses:  Chest pain, unspecified type    Rx / DC Orders ED Discharge Orders          Ordered     famotidine (PEPCID) 20 MG tablet  2 times daily        08/08/22 1819    omeprazole (PRILOSEC) 20 MG capsule  Daily        08/08/22 1819    Ambulatory referral to Cardiology       Comments: If you have not heard from the Cardiology office within the next 72 hours please call [phone].   08/08/22 1820              Jeanelle Malling, PA 08/08/22 1829    Melene Plan, DO 08/08/22 1831

## 2022-08-08 NOTE — ED Triage Notes (Signed)
Pt arrives to ED with c/o left sided chest pain with radiation to his left shoulder blade that started x4 days ago. He notes it feels like it is behind his pec and trap. Deep breathes tend to make the pain worse and described as sharp. The pain started when pt was at rest.

## 2022-08-08 NOTE — Discharge Instructions (Addendum)
Please take your medications as prescribed. Take tylenol/ibuprofen for pain. I recommend close follow-up with cardiology for reevaluation.  Please do not hesitate to return to emergency department if worrisome signs symptoms we discussed become apparent.

## 2024-03-15 ENCOUNTER — Other Ambulatory Visit: Payer: Self-pay | Admitting: Nurse Practitioner

## 2024-03-15 ENCOUNTER — Ambulatory Visit
Admission: RE | Admit: 2024-03-15 | Discharge: 2024-03-15 | Disposition: A | Discharge status: Home / Self Care | Source: Ambulatory Visit | Attending: Nurse Practitioner | Admitting: Nurse Practitioner

## 2024-03-15 DIAGNOSIS — T1490XA Injury, unspecified, initial encounter: Secondary | ICD-10-CM

## 2024-03-15 DIAGNOSIS — M79674 Pain in right toe(s): Secondary | ICD-10-CM
